# Patient Record
Sex: Female | Born: 1965 | Race: White | Hispanic: No | Marital: Married
Health system: Southern US, Community
[De-identification: ages and names within clinical notes are randomized; demographics above are authoritative.]

---

## 1998-01-15 ENCOUNTER — Ambulatory Visit (HOSPITAL_COMMUNITY): Admission: RE | Admit: 1998-01-15 | Discharge: 1998-01-15 | Payer: Self-pay | Admitting: Gastroenterology

## 1998-07-20 ENCOUNTER — Ambulatory Visit (HOSPITAL_COMMUNITY): Admission: RE | Admit: 1998-07-20 | Discharge: 1998-07-20 | Payer: Self-pay | Admitting: Family Medicine

## 1999-02-09 ENCOUNTER — Encounter: Admission: RE | Admit: 1999-02-09 | Discharge: 1999-05-10 | Payer: Self-pay | Admitting: Gynecology

## 1999-08-28 ENCOUNTER — Inpatient Hospital Stay (HOSPITAL_COMMUNITY): Admission: AD | Admit: 1999-08-28 | Discharge: 1999-08-31 | Payer: Self-pay | Admitting: Obstetrics and Gynecology

## 1999-09-26 ENCOUNTER — Other Ambulatory Visit: Admission: RE | Admit: 1999-09-26 | Discharge: 1999-09-26 | Payer: Self-pay | Admitting: Gynecology

## 2000-10-02 ENCOUNTER — Other Ambulatory Visit: Admission: RE | Admit: 2000-10-02 | Discharge: 2000-10-02 | Payer: Self-pay | Admitting: Gynecology

## 2001-10-07 ENCOUNTER — Other Ambulatory Visit: Admission: RE | Admit: 2001-10-07 | Discharge: 2001-10-07 | Payer: Self-pay | Admitting: Gynecology

## 2001-12-20 ENCOUNTER — Ambulatory Visit (HOSPITAL_COMMUNITY): Admission: RE | Admit: 2001-12-20 | Discharge: 2001-12-20 | Payer: Self-pay | Admitting: Gynecology

## 2001-12-20 ENCOUNTER — Encounter: Payer: Self-pay | Admitting: Gynecology

## 2002-10-20 ENCOUNTER — Other Ambulatory Visit: Admission: RE | Admit: 2002-10-20 | Discharge: 2002-10-20 | Payer: Self-pay | Admitting: Gynecology

## 2016-08-01 DIAGNOSIS — H40013 Open angle with borderline findings, low risk, bilateral: Secondary | ICD-10-CM | POA: Diagnosis not present

## 2016-10-06 DIAGNOSIS — K754 Autoimmune hepatitis: Secondary | ICD-10-CM | POA: Diagnosis not present

## 2016-10-09 ENCOUNTER — Other Ambulatory Visit: Payer: Self-pay | Admitting: Family Medicine

## 2016-10-09 DIAGNOSIS — Z1231 Encounter for screening mammogram for malignant neoplasm of breast: Secondary | ICD-10-CM

## 2016-10-12 ENCOUNTER — Ambulatory Visit
Admission: RE | Admit: 2016-10-12 | Discharge: 2016-10-12 | Disposition: A | Discharge status: Home / Self Care | Payer: 59 | Source: Ambulatory Visit | Attending: Family Medicine | Admitting: Family Medicine

## 2016-10-12 DIAGNOSIS — Z1231 Encounter for screening mammogram for malignant neoplasm of breast: Secondary | ICD-10-CM

## 2016-11-22 DIAGNOSIS — E039 Hypothyroidism, unspecified: Secondary | ICD-10-CM | POA: Diagnosis not present

## 2016-11-22 DIAGNOSIS — E7800 Pure hypercholesterolemia, unspecified: Secondary | ICD-10-CM | POA: Diagnosis not present

## 2017-06-18 DIAGNOSIS — E039 Hypothyroidism, unspecified: Secondary | ICD-10-CM | POA: Diagnosis not present

## 2017-06-18 DIAGNOSIS — E7800 Pure hypercholesterolemia, unspecified: Secondary | ICD-10-CM | POA: Diagnosis not present

## 2017-06-18 DIAGNOSIS — Z23 Encounter for immunization: Secondary | ICD-10-CM | POA: Diagnosis not present

## 2017-06-29 DIAGNOSIS — K754 Autoimmune hepatitis: Secondary | ICD-10-CM | POA: Diagnosis not present

## 2017-08-02 DIAGNOSIS — E039 Hypothyroidism, unspecified: Secondary | ICD-10-CM | POA: Diagnosis not present

## 2018-02-14 DIAGNOSIS — E039 Hypothyroidism, unspecified: Secondary | ICD-10-CM | POA: Diagnosis not present

## 2018-02-14 DIAGNOSIS — E7800 Pure hypercholesterolemia, unspecified: Secondary | ICD-10-CM | POA: Diagnosis not present

## 2018-04-02 DIAGNOSIS — K754 Autoimmune hepatitis: Secondary | ICD-10-CM | POA: Diagnosis not present

## 2018-04-09 ENCOUNTER — Other Ambulatory Visit: Payer: Self-pay | Admitting: Internal Medicine

## 2018-04-09 DIAGNOSIS — K754 Autoimmune hepatitis: Secondary | ICD-10-CM

## 2018-04-22 ENCOUNTER — Ambulatory Visit
Admission: RE | Admit: 2018-04-22 | Discharge: 2018-04-22 | Disposition: A | Discharge status: Home / Self Care | Payer: 59 | Source: Ambulatory Visit | Attending: Internal Medicine | Admitting: Internal Medicine

## 2018-04-22 DIAGNOSIS — K754 Autoimmune hepatitis: Secondary | ICD-10-CM

## 2018-05-23 DIAGNOSIS — Z1212 Encounter for screening for malignant neoplasm of rectum: Secondary | ICD-10-CM | POA: Diagnosis not present

## 2018-05-23 DIAGNOSIS — Z1211 Encounter for screening for malignant neoplasm of colon: Secondary | ICD-10-CM | POA: Diagnosis not present

## 2018-05-28 DIAGNOSIS — E039 Hypothyroidism, unspecified: Secondary | ICD-10-CM | POA: Diagnosis not present

## 2018-06-25 DIAGNOSIS — Z1211 Encounter for screening for malignant neoplasm of colon: Secondary | ICD-10-CM | POA: Diagnosis not present

## 2018-06-25 DIAGNOSIS — K635 Polyp of colon: Secondary | ICD-10-CM | POA: Diagnosis not present

## 2018-06-25 DIAGNOSIS — R195 Other fecal abnormalities: Secondary | ICD-10-CM | POA: Diagnosis not present

## 2018-07-05 DIAGNOSIS — E039 Hypothyroidism, unspecified: Secondary | ICD-10-CM | POA: Diagnosis not present

## 2018-08-05 DIAGNOSIS — E039 Hypothyroidism, unspecified: Secondary | ICD-10-CM | POA: Diagnosis not present

## 2019-05-14 IMAGING — US US ABDOMEN LIMITED W/ ELASTOGRAPHY
1 series · 12 of 12 positions shown · non-contrast
Comparison: None.

CLINICAL DATA: Hepatitis

EXAM:
US ABDOMEN LIMITED - RIGHT UPPER QUADRANT
ULTRASOUND HEPATIC ELASTOGRAPHY
TECHNIQUE: Limited right upper quadrant abdominal ultrasound was performed. In
addition, ultrasound elastography evaluation of the liver was
performed. A region of interest was placed in the right lobe of the
liver. Following application of a compressive sonographic pulse,
shear waves were detected in the adjacent hepatic tissue and the
shear wave velocity was calculated. Multiple assessments were
performed at the selected site. Median shear wave velocity is
correlated to a Metavir fibrosis score.

[Series 1: us abdomen limited w/ elastography · 0.15mm/px · 12 of 12 slices shown]
[im 1/12]
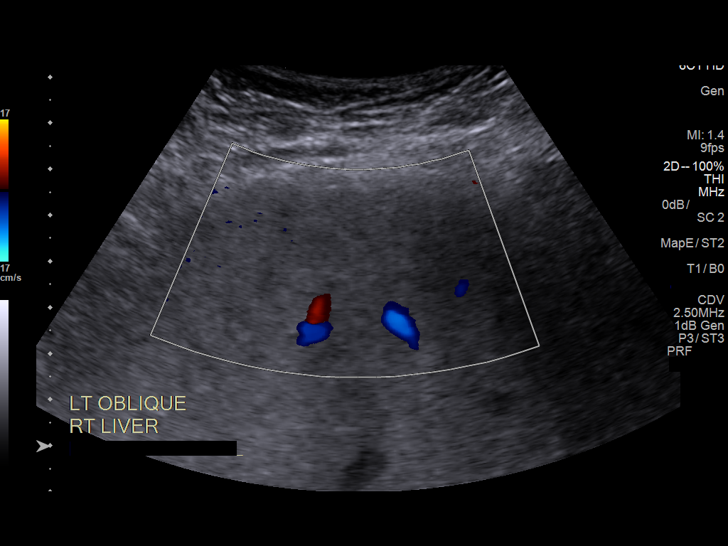
[im 2/12]
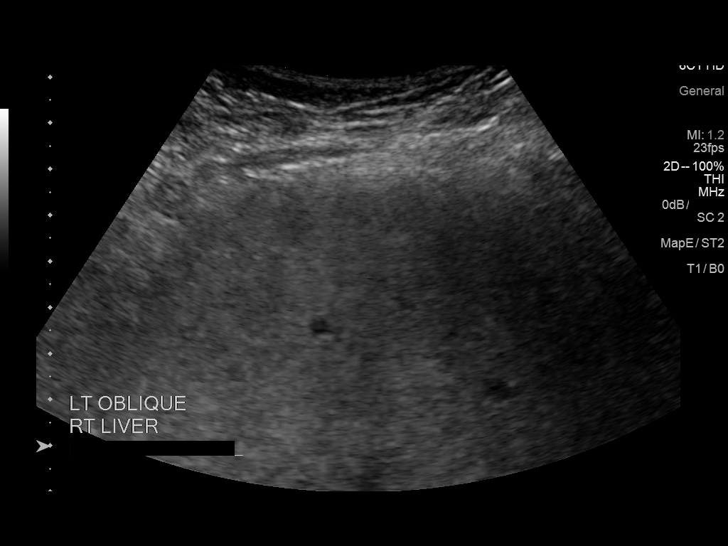
[im 3/12]
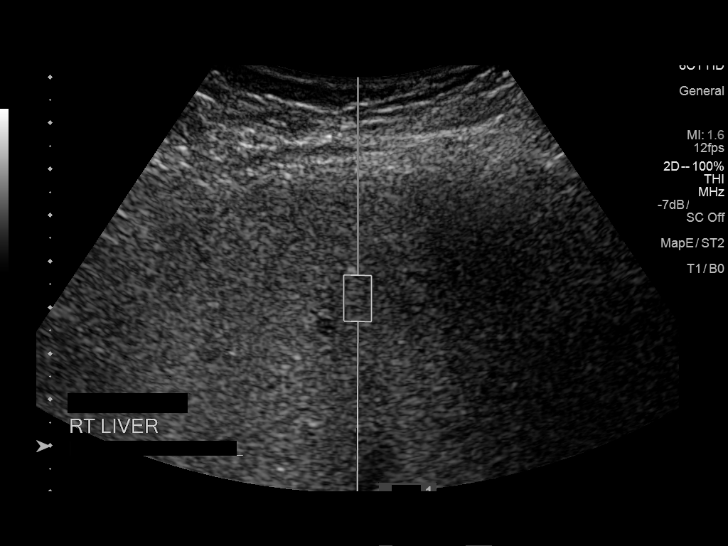
[im 4/12]
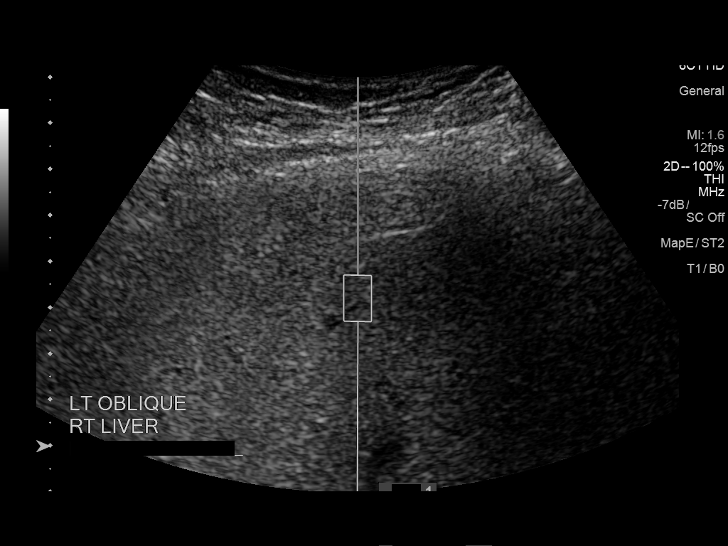
[im 5/12]
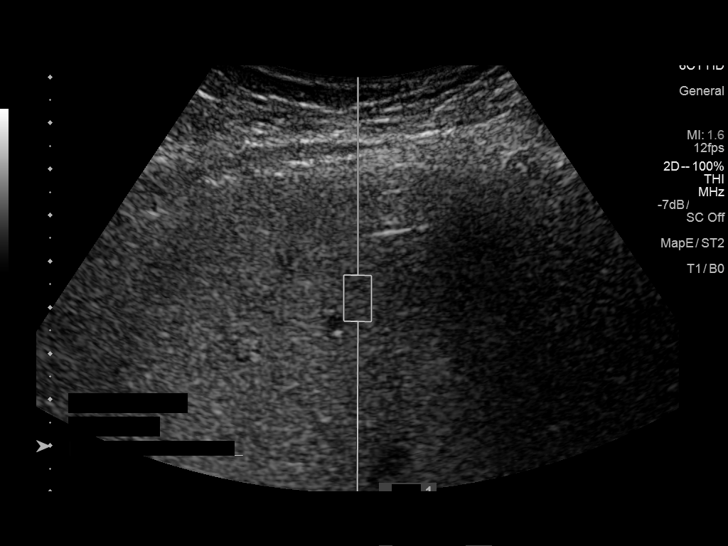
[im 6/12]
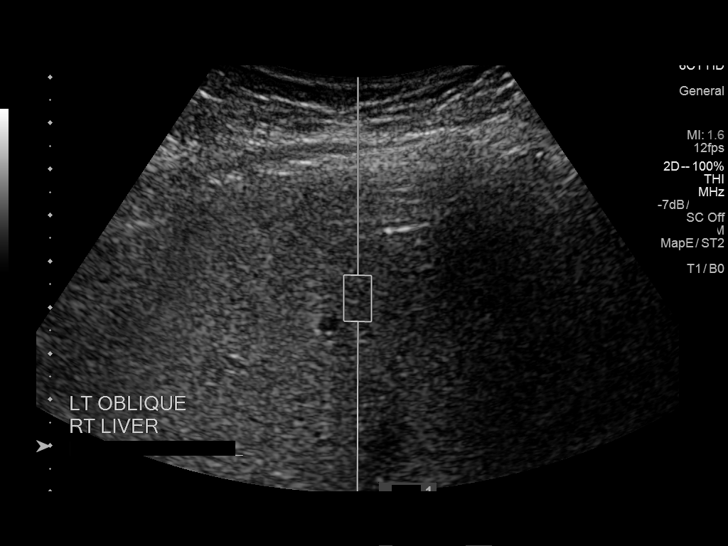
[im 7/12]
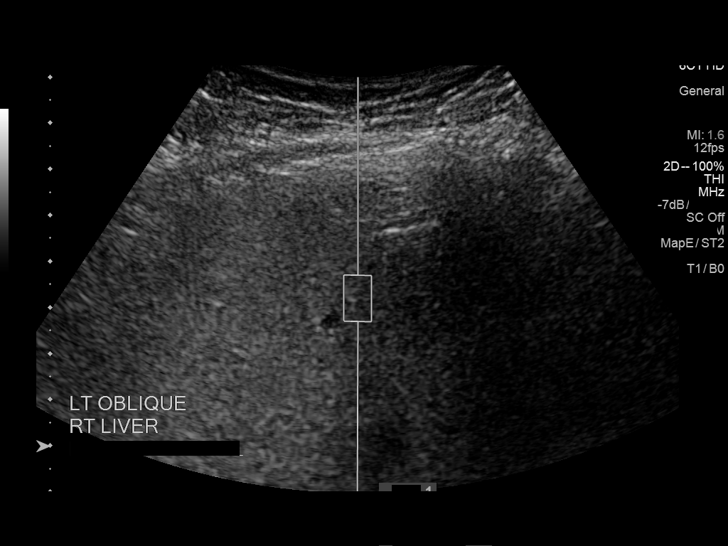
[im 8/12]
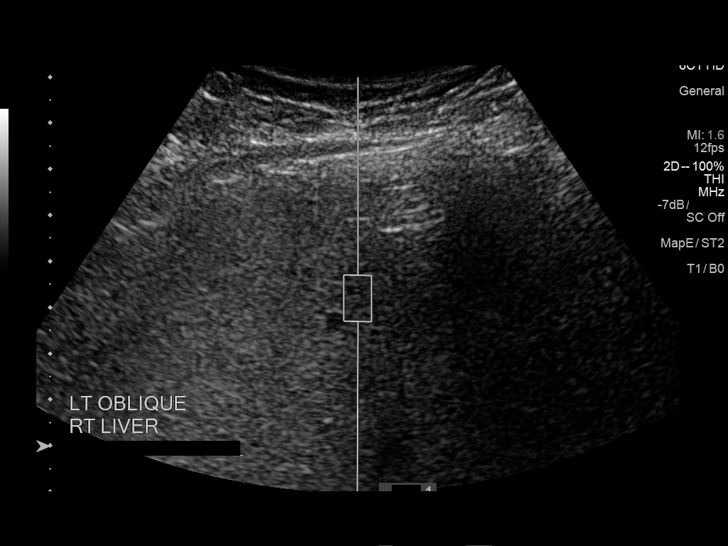
[im 9/12]
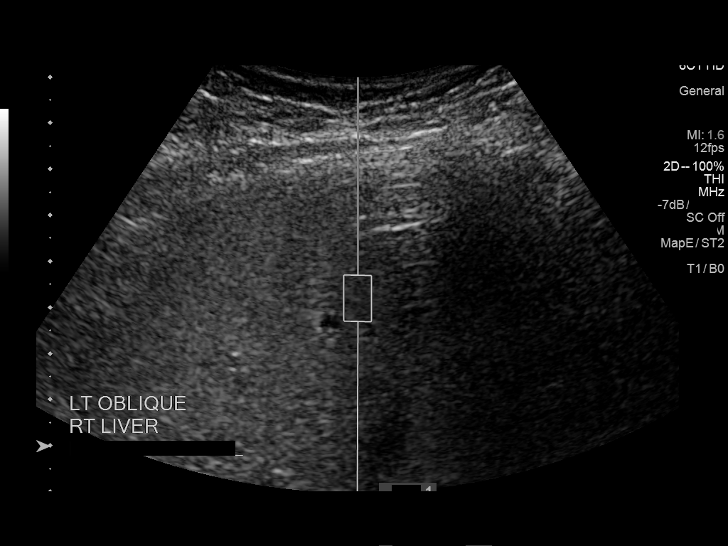
[im 10/12]
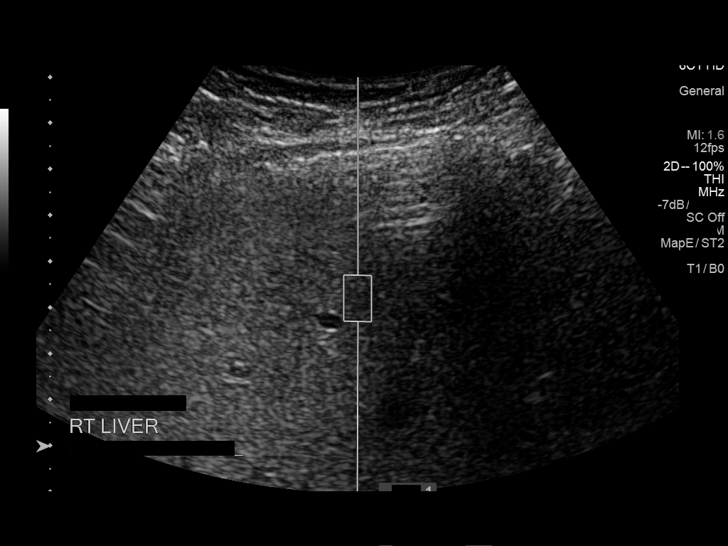
[im 11/12]
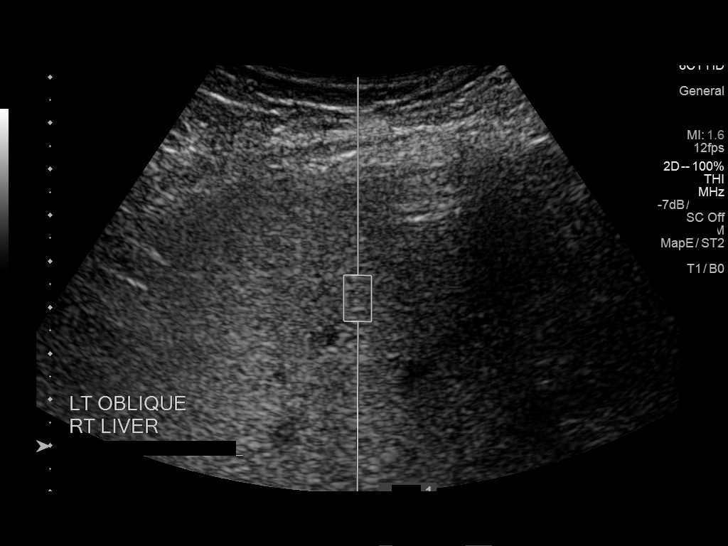
[im 12/12]
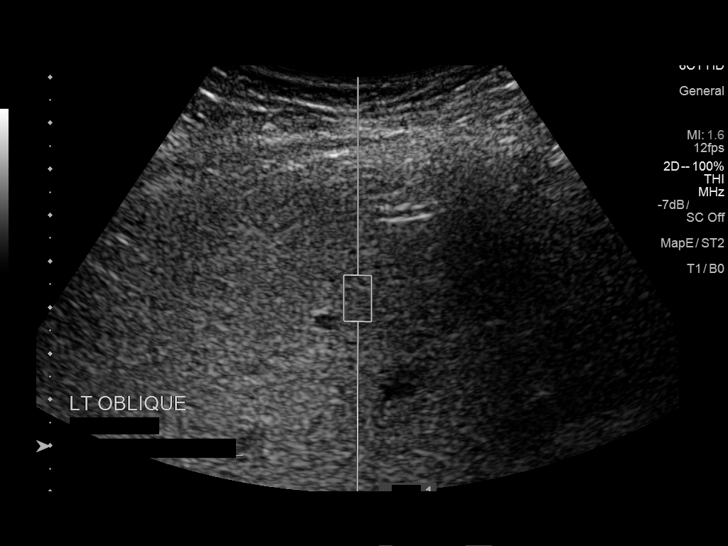

[12 of 12 positions shown; findings below may reference images not displayed]

FINDINGS: ULTRASOUND ABDOMEN LIMITED RIGHT UPPER QUADRANT

Gallbladder:

Multiple mobile gallstones measuring up to 1.7 cm in long axis.
Sonographic Murphy's sign absent. No gallbladder wall thickening or
pericholecystic fluid.

Common bile duct:

Diameter: 4 mm

Liver:

No focal lesion identified. Parenchymal heterogeneity. Portal vein
is patent on color Doppler imaging with normal direction of blood
flow towards the liver.

ULTRASOUND HEPATIC ELASTOGRAPHY

Device: Siemens Helix VTQ

Patient position: Oblique

Transducer [id number]

Number of measurements: 10

Hepatic segment:  8

Median velocity:   0.74 m/sec

IQR:

IQR/Median velocity ratio:

Corresponding Metavir fibrosis score:  F0/F1

Risk of fibrosis: Minimal

Limitations of exam: None

Please note that abnormal shear wave velocities may also be
identified in clinical settings other than with hepatic fibrosis,
such as: acute hepatitis, elevated right heart and central venous
pressures including use of beta blockers, Daud disease
(Gi), infiltrative processes such as
mastocytosis/amyloidosis/infiltrative tumor, extrahepatic
cholestasis, in the post-prandial state, and liver transplantation.
Correlation with patient history, laboratory data, and clinical
condition recommended.
IMPRESSION: ULTRASOUND ABDOMEN:

1. Mild parenchymal heterogeneity in the liver may could be a
manifestation of hepatitis but is not florid/striking, and might
also be seen in the setting of geographic hepatic steatosis.
2. Several gallstones are present measuring up to 1.7 cm in length.
No gallbladder wall thickening or sonographic Murphy sign.

ULTRASOUND HEPATIC ELASTOGRAHY:

Median hepatic shear wave velocity is calculated at 0.74 m/sec.

Corresponding Metavir fibrosis score is F0/F1.

Risk of fibrosis is minimal.

Follow-up: None required

## 2019-10-16 ENCOUNTER — Ambulatory Visit: Payer: BLUE CROSS/BLUE SHIELD | Attending: Internal Medicine

## 2019-10-16 DIAGNOSIS — Z23 Encounter for immunization: Secondary | ICD-10-CM

## 2019-10-16 NOTE — Progress Notes (Signed)
   Covid-19 Vaccination Clinic  Name:  SCHUYLER BEHAN    MRN: [id number] DOB: 1965/09/13  10/16/2019  Ms. Hakeem was observed post Covid-19 immunization for 15 minutes without incident. She was provided with Vaccine Information Sheet and instruction to access the V-Safe system.   Ms. Eugenio was instructed to call 911 with any severe reactions post vaccine: Marland Kitchen Difficulty breathing  . Swelling of face and throat  . A fast heartbeat  . A bad rash all over body  . Dizziness and weakness   Immunizations Administered    Name Date Dose VIS Date Route   Pfizer COVID-19 Vaccine 10/16/2019  9:14 AM 0.3 mL 07/04/2019 Intramuscular   Manufacturer: [employer], [employer]   Lot: [id number]   NDC: [address]-3

## 2019-11-10 ENCOUNTER — Ambulatory Visit: Payer: BLUE CROSS/BLUE SHIELD | Attending: Internal Medicine

## 2019-11-10 DIAGNOSIS — Z23 Encounter for immunization: Secondary | ICD-10-CM

## 2019-11-10 NOTE — Progress Notes (Signed)
   Covid-19 Vaccination Clinic  Name:  Judith Schmidt    MRN: [id number] DOB: [birth year]  11/10/2019  [name] was observed post Covid-19 immunization for 15 minutes without incident. She was provided with Vaccine Information Sheet and instruction to access the V-Safe system.   [name] was instructed to call 911 with any severe reactions post vaccine: Marland Kitchen Difficulty breathing  . Swelling of face and throat  . A fast heartbeat  . A bad rash all over body  . Dizziness and weakness   Immunizations Administered    Name Date Dose VIS Date Route   Pfizer COVID-19 Vaccine 11/10/2019  8:45 AM 0.3 mL 09/17/2018 Intramuscular   Manufacturer: [employer], [employer]   Lot: [id number]   NDC: [address]-7

## 2022-02-08 DIAGNOSIS — K754 Autoimmune hepatitis: Secondary | ICD-10-CM | POA: Diagnosis not present

## 2022-02-22 DIAGNOSIS — K760 Fatty (change of) liver, not elsewhere classified: Secondary | ICD-10-CM | POA: Diagnosis not present

## 2022-02-22 DIAGNOSIS — K754 Autoimmune hepatitis: Secondary | ICD-10-CM | POA: Diagnosis not present

## 2022-02-22 DIAGNOSIS — K8020 Calculus of gallbladder without cholecystitis without obstruction: Secondary | ICD-10-CM | POA: Diagnosis not present

## 2022-02-23 DIAGNOSIS — D84821 Immunodeficiency due to drugs: Secondary | ICD-10-CM | POA: Diagnosis not present

## 2022-02-23 DIAGNOSIS — Z89021 Acquired absence of right finger(s): Secondary | ICD-10-CM | POA: Diagnosis not present

## 2022-02-23 DIAGNOSIS — K754 Autoimmune hepatitis: Secondary | ICD-10-CM | POA: Diagnosis not present

## 2022-02-23 DIAGNOSIS — E039 Hypothyroidism, unspecified: Secondary | ICD-10-CM | POA: Diagnosis not present

## 2022-02-23 DIAGNOSIS — D8481 Immunodeficiency due to conditions classified elsewhere: Secondary | ICD-10-CM | POA: Diagnosis not present

## 2022-02-23 DIAGNOSIS — Z87891 Personal history of nicotine dependence: Secondary | ICD-10-CM | POA: Diagnosis not present

## 2022-02-23 DIAGNOSIS — Z8249 Family history of ischemic heart disease and other diseases of the circulatory system: Secondary | ICD-10-CM | POA: Diagnosis not present

## 2022-02-23 DIAGNOSIS — R69 Illness, unspecified: Secondary | ICD-10-CM | POA: Diagnosis not present

## 2022-07-26 DIAGNOSIS — E039 Hypothyroidism, unspecified: Secondary | ICD-10-CM | POA: Diagnosis not present

## 2023-03-02 DIAGNOSIS — Z1211 Encounter for screening for malignant neoplasm of colon: Secondary | ICD-10-CM | POA: Diagnosis not present

## 2023-03-02 DIAGNOSIS — K754 Autoimmune hepatitis: Secondary | ICD-10-CM | POA: Diagnosis not present

## 2023-08-17 DIAGNOSIS — E039 Hypothyroidism, unspecified: Secondary | ICD-10-CM | POA: Diagnosis not present

## 2023-08-17 DIAGNOSIS — F321 Major depressive disorder, single episode, moderate: Secondary | ICD-10-CM | POA: Diagnosis not present

## 2023-08-17 DIAGNOSIS — Z0000 Encounter for general adult medical examination without abnormal findings: Secondary | ICD-10-CM | POA: Diagnosis not present

## 2023-08-17 DIAGNOSIS — K754 Autoimmune hepatitis: Secondary | ICD-10-CM | POA: Diagnosis not present

## 2023-08-17 DIAGNOSIS — Z23 Encounter for immunization: Secondary | ICD-10-CM | POA: Diagnosis not present

## 2023-09-21 DIAGNOSIS — K754 Autoimmune hepatitis: Secondary | ICD-10-CM | POA: Diagnosis not present

## 2024-02-27 DIAGNOSIS — K760 Fatty (change of) liver, not elsewhere classified: Secondary | ICD-10-CM | POA: Diagnosis not present

## 2024-02-27 DIAGNOSIS — K754 Autoimmune hepatitis: Secondary | ICD-10-CM | POA: Diagnosis not present

## 2024-02-27 DIAGNOSIS — K8020 Calculus of gallbladder without cholecystitis without obstruction: Secondary | ICD-10-CM | POA: Diagnosis not present
